# Patient Record
Sex: Male | Born: 1970 | Race: White | Hispanic: No | Marital: Married
Health system: Southern US, Community
[De-identification: ages and names within clinical notes are randomized; demographics above are authoritative.]

## PROBLEM LIST (undated history)

## (undated) DIAGNOSIS — I10 Essential (primary) hypertension: Secondary | ICD-10-CM

---

## 2021-02-03 ENCOUNTER — Emergency Department (HOSPITAL_COMMUNITY)
Admission: EM | Admit: 2021-02-03 | Discharge: 2021-02-03 | Disposition: A | Discharge status: Home / Self Care | Payer: BLUE CROSS/BLUE SHIELD | Attending: Emergency Medicine | Admitting: Emergency Medicine

## 2021-02-03 ENCOUNTER — Emergency Department (HOSPITAL_COMMUNITY): Payer: BLUE CROSS/BLUE SHIELD

## 2021-02-03 ENCOUNTER — Encounter (HOSPITAL_COMMUNITY): Payer: Self-pay | Admitting: *Deleted

## 2021-02-03 DIAGNOSIS — R079 Chest pain, unspecified: Secondary | ICD-10-CM | POA: Diagnosis present

## 2021-02-03 DIAGNOSIS — R42 Dizziness and giddiness: Secondary | ICD-10-CM | POA: Diagnosis not present

## 2021-02-03 DIAGNOSIS — I10 Essential (primary) hypertension: Secondary | ICD-10-CM | POA: Diagnosis not present

## 2021-02-03 DIAGNOSIS — Z79899 Other long term (current) drug therapy: Secondary | ICD-10-CM | POA: Insufficient documentation

## 2021-02-03 HISTORY — DX: Essential (primary) hypertension: I10

## 2021-02-03 LAB — BASIC METABOLIC PANEL
Anion gap: 9 (ref 5–15)
BUN: 13 mg/dL (ref 6–20)
CO2: 23 mmol/L (ref 22–32)
Calcium: 8.9 mg/dL (ref 8.9–10.3)
Chloride: 105 mmol/L (ref 98–111)
Creatinine, Ser: 0.97 mg/dL (ref 0.61–1.24)
GFR, Estimated: >60 mL/min (ref >60)
Glucose, Bld: 96 mg/dL (ref 70–99)
Potassium: 3.7 mmol/L (ref 3.5–5.1)
Sodium: 137 mmol/L (ref 135–145)

## 2021-02-03 LAB — CBC WITH DIFFERENTIAL/PLATELET
Abs Immature Granulocytes: 0.04 10*3/uL (ref 0.00–0.07)
Basophils Absolute: 0.1 10*3/uL (ref 0.0–0.1)
Basophils Relative: 1 %
Eosinophils Absolute: 0.1 10*3/uL (ref 0.0–0.5)
Eosinophils Relative: 1 %
HCT: 43.1 % (ref 39.0–52.0)
Hemoglobin: 15.4 g/dL (ref 13.0–17.0)
Immature Granulocytes: 1 %
Lymphocytes Relative: 36 %
Lymphs Abs: 2.2 10*3/uL (ref 0.7–4.0)
MCH: 32.8 pg (ref 26.0–34.0)
MCHC: 35.7 g/dL (ref 30.0–36.0)
MCV: 91.9 fL (ref 80.0–100.0)
Monocytes Absolute: 0.5 10*3/uL (ref 0.1–1.0)
Monocytes Relative: 8 %
Neutro Abs: 3.1 10*3/uL (ref 1.7–7.7)
Neutrophils Relative %: 53 %
Platelets: 219 10*3/uL (ref 150–400)
RBC: 4.69 MIL/uL (ref 4.22–5.81)
RDW: 13.2 % (ref 11.5–15.5)
WBC: 5.9 10*3/uL (ref 4.0–10.5)
nRBC: 0 % (ref 0.0–0.2)

## 2021-02-03 LAB — TROPONIN I (HIGH SENSITIVITY)
Troponin I (High Sensitivity): 7 ng/L (ref <18)
Troponin I (High Sensitivity): 7 ng/L (ref <18)

## 2021-02-03 MED ORDER — LABETALOL HCL 5 MG/ML IV SOLN
10.0000 mg | Freq: Once | INTRAVENOUS | Status: AC
  Administered 2021-02-03: 10 mg via INTRAVENOUS
  Filled 2021-02-03: qty 4

## 2021-02-03 MED ORDER — LOSARTAN POTASSIUM 25 MG PO TABS
50.0000 mg | ORAL_TABLET | Freq: Once | ORAL | Status: AC
  Administered 2021-02-03: 50 mg via ORAL
  Filled 2021-02-03: qty 2

## 2021-02-03 NOTE — Discharge Instructions (Signed)
Follow-up with your doctor.  Continue take your blood pressure medicine.  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  Your doctor can help follow-up with this a little bit more.  It does not appear to be the cause of today's episode.

## 2021-02-03 NOTE — ED Provider Notes (Signed)
[facility] EMERGENCY DEPARTMENT Provider Note   CSN: [id number] Arrival date & time: 02/03/21  1157     History Chief Complaint  Patient presents with   Chest Pain    Charles Reyes is a 50 y.o. male.  The history is provided by the patient.  Chest Pain Associated symptoms: dizziness and headache   Associated symptoms: no abdominal pain, no back pain and no shortness of breath   Patient presents with chest pressure headache dizziness and hypertension.  Has a history of hypertension and is on 50 mg losartan for it.  States has been out of the medicine for around 4 days because he is traveling.  States he left at home.  Due to go back home on a 730 flight to [place] today.  States he has a dull headache.  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  Slight chest tightness.  Checked a blood pressure and it was elevated.  Has shakiness in his eyes but states this is chronic since he was a kid.    Past Medical History:  Diagnosis Date   Hypertension   Crohns disease  There are no problems to display for this patient.   History reviewed. No pertinent surgical history.     No family history on file.     Home Medications Prior to Admission medications   Medication Sig Start Date End Date Taking? Authorizing Provider  acetaminophen (TYLENOL) 325 MG tablet Take 325 mg by mouth every 4 (four) hours as needed for mild pain, moderate pain, headache or fever. 02/02/21  Yes [provider]  dexlansoprazole (DEXILANT) 60 MG capsule Take 1 capsule by mouth daily. 11/24/20  Yes [provider]  diphenhydrAMINE (BENADRYL) 50 MG/ML injection Inject 50 mg into the muscle once. 02/02/21  Yes [provider]  losartan (COZAAR) 50 MG tablet Take 50 mg by mouth daily. 02/03/21  Yes [provider]  mercaptopurine (PURINETHOL) 50 MG tablet Take 50 mg by mouth daily. 12/20/20  Yes [provider]  REMICADE 100 MG injection Inject into  the vein. 02/02/21  Yes [provider]  XYOSTED 75 MG/0.5ML SOAJ Inject 75 mg into the skin once a week. 01/09/21  Yes [provider]    Allergies    Doxycycline  Review of Systems   Review of Systems  Constitutional:  Negative for appetite change.  HENT:  Negative for congestion.   Respiratory:  Negative for shortness of breath.   Cardiovascular:  Positive for chest pain.  Gastrointestinal:  Negative for abdominal pain.  Genitourinary:  Negative for flank pain.  Musculoskeletal:  Negative for back pain.  Skin:  Negative for rash.  Neurological:  Positive for dizziness and headaches.  Psychiatric/Behavioral:  Negative for confusion.    Physical Exam Updated Vital Signs BP (!) 161/136   Pulse 77   Temp 98.5 F (36.9 C) (Oral)   Resp 17   SpO2 98%   Physical Exam Vitals and nursing note reviewed.  Eyes:     Comments: Patient with baseline lateral nystagmus.  Chronic for patient.  Cardiovascular:     Rate and Rhythm: Regular rhythm.  Pulmonary:     Breath sounds: No wheezing.  Chest:     Chest wall: No tenderness.  Abdominal:     Tenderness: There is no abdominal tenderness.  Musculoskeletal:     Cervical back: Neck supple.     Right lower leg: No edema.     Left lower leg: No edema.  Skin:    General: Skin is warm.     Capillary Refill: Capillary refill takes less than 2 seconds.  Neurological:     Mental Status: He is alert.     Comments: Nystagmus.  Chronic.  Extraocular movements otherwise intact.  Finger-nose intact bilaterally.    ED Results / Procedures / Treatments   Labs (all labs ordered are listed, but only abnormal results are displayed) Labs Reviewed  CBC WITH DIFFERENTIAL/PLATELET  BASIC METABOLIC PANEL  TROPONIN I (HIGH SENSITIVITY)  TROPONIN I (HIGH SENSITIVITY)    EKG EKG Interpretation  Date/Time:  Friday February 03 2021 12:07:29 EST Ventricular Rate:  77 PR Interval:  144 QRS Duration: 87 QT Interval:  356 QTC  Calculation: 403 R Axis:   72 Text Interpretation: Sinus rhythm Borderline repolarization abnormality Confirmed by Benjiman Core [id number]) on 02/03/2021 12:37:53 PM  Radiology CT Head Wo Contrast  Result Date: 02/03/2021 CLINICAL DATA:  Headache with dizziness and nausea. EXAM: CT HEAD WITHOUT CONTRAST TECHNIQUE: Contiguous axial images were obtained from the base of the skull through the vertex without intravenous contrast. COMPARISON:  None. FINDINGS: Brain: There is no evidence for acute hemorrhage, hydrocephalus, mass lesion, or abnormal extra-axial fluid collection. No definite CT evidence for acute infarction. Tiny lacunar infarct noted left cerebellum. Vascular: No hyperdense vessel or unexpected calcification. Skull: No evidence for fracture. No worrisome lytic or sclerotic lesion. Sinuses/Orbits: The visualized paranasal sinuses and mastoid air cells are clear. Visualized portions of the globes and intraorbital fat are unremarkable. Other: None. IMPRESSION: 1. No acute intracranial abnormality. 2. Tiny lacunar infarct in the left cerebellum. Electronically Signed   By: Kennith Center M.D.   On: 02/03/2021 13:23   MR BRAIN WO CONTRAST  Result Date: 02/03/2021 CLINICAL DATA:  Dizziness, nonspecific. Additional history provided: Patient reports running out of blood pressure medication, dizziness, nausea, chest pain. EXAM: MRI HEAD WITHOUT CONTRAST TECHNIQUE: Multiplanar, multiecho pulse sequences of the brain and surrounding structures were obtained without intravenous contrast. COMPARISON:  Head CT 02/03/2021. FINDINGS: Brain: Cerebral volume is within normal limits for age. Small chronic lacunar infarct within the inferior left cerebellar hemisphere. No cortical encephalomalacia is identified. No significant cerebral white matter disease. There is no acute infarct. No evidence of an intracranial mass. No chronic intracranial blood products. No extra-axial fluid collection. No midline shift.  Vascular: Maintained flow voids within the proximal large arterial vessels. 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 Skull and upper cervical spine: No focal suspicious marrow lesion. Sinuses/Orbits: Visualized orbits show no acute finding. No significant paranasal sinus disease. IMPRESSION: No evidence of acute intracranial abnormality. Small chronic lacunar infarct within the inferior left cerebellar hemisphere. Otherwise unremarkable non-contrast MRI appearance of the brain. Electronically Signed   By: Jackey Loge D.O.   On: 02/03/2021 14:49   DG Chest Portable 1 View  Result Date: 02/03/2021 CLINICAL DATA:  Chest pain, dizziness EXAM: PORTABLE CHEST 1 VIEW COMPARISON:  None. FINDINGS: The heart size and mediastinal contours are within normal limits. Both lungs are clear. The visualized skeletal structures are unremarkable. IMPRESSION: No active disease. Electronically Signed   By: Larose Hires D.O.   On: 02/03/2021 12:55    Procedures Procedures   Medications Ordered in ED Medications  losartan (COZAAR) tablet 50 mg (50 mg Oral Given 02/03/21 1249)  labetalol (NORMODYNE) injection 10 mg (10 mg Intravenous Given 02/03/21 1250)    ED Course  I have reviewed the triage vital signs and the nursing notes.  Pertinent labs & imaging results that were available during my care of the patient were reviewed by me and considered in my medical decision making (see chart for details).    MDM Rules/Calculators/A&P                          Patient presents with hypertension and headache.  Has hypertension likely due to noncompliance with medication since he left town.  However developed headache and chest pain.  Feeling dizzy and some nauseousness.  Does have a chronic nystagmus since he had since he was a child. Head CT showed possible old cerebellar stroke.  MRI done due to neurodeficits including dizziness.  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  Patient was unaware of this.  Unsure of cause.  Or even timing of it.  Blood pressure better and symptoms improved.  Will discharge home.  Patient is flying back to [place] today  Final Clinical Impression(s) / ED Diagnoses Final diagnoses:  Hypertension, unspecified type    Rx / DC Orders ED Discharge Orders     None        Benjiman Core, MD 02/03/21 1529

## 2021-02-03 NOTE — ED Notes (Signed)
Pt to MRI

## 2021-02-03 NOTE — ED Triage Notes (Signed)
States he has been out of blood pressure medications for the past 4 days. C/o feeling dizzy and nauseated. Has a headache and also has chest pain. State he travels and left his medication at home.

## 2022-11-07 IMAGING — MR MR HEAD W/O CM
9 of 10 series · 39 of 48 positions shown · non-contrast
Comparison: Head CT 02/03/2021.

CLINICAL DATA: Dizziness, nonspecific. Additional history provided:
Patient reports running out of blood pressure medication, dizziness,
nausea, chest pain.

EXAM:
MRI HEAD WITHOUT CONTRAST
TECHNIQUE: Multiplanar, multiecho pulse sequences of the brain and surrounding
structures were obtained without intravenous contrast.

[Series 5: DWI · axial · 4.0mm · 0.88mm/px · z∈[-72,+68]mm · 5 of 36 slices shown (1 of 4)]
[im 1/36]
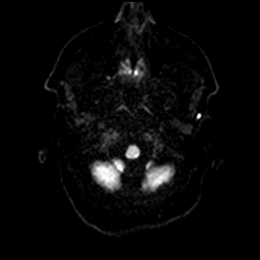
[im 9/36]
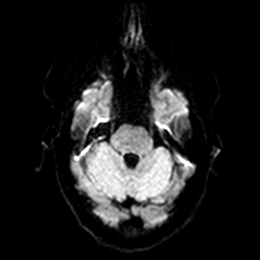
[im 18/36]
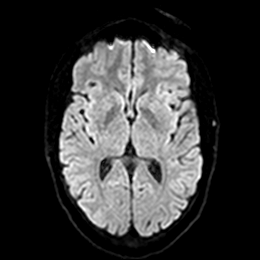
[im 27/36]
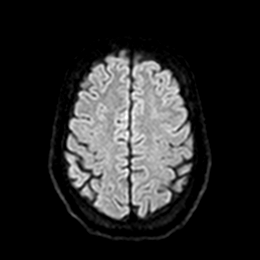
[im 36/36]
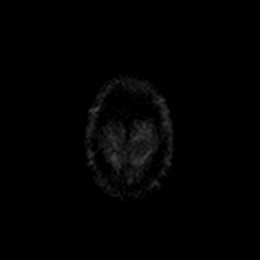

[Series 6: DWI · axial · 4.0mm · 0.88mm/px · z∈[-72,+68]mm · 5 of 36 slices shown (2 of 4)]
[im 1/36]
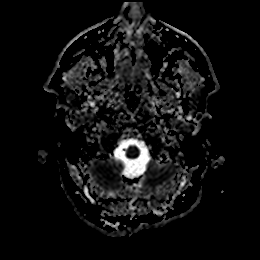
[im 9/36]
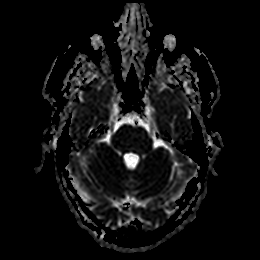
[im 18/36]
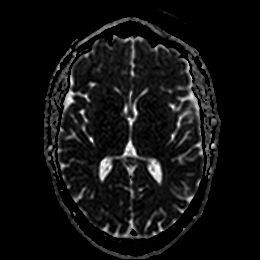
[im 27/36]
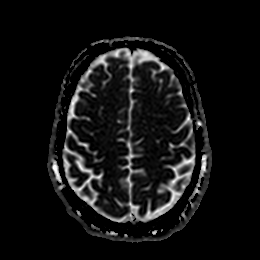
[im 36/36]
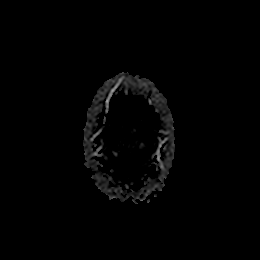

[Series 7: DWI · coronal · 4.0mm · 0.88mm/px · 5 of 32 slices shown (3 of 4)]
[im 1/32]
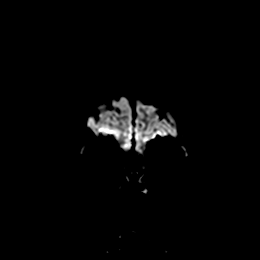
[im 8/32]
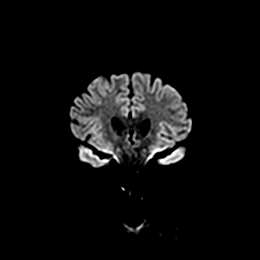
[im 16/32]
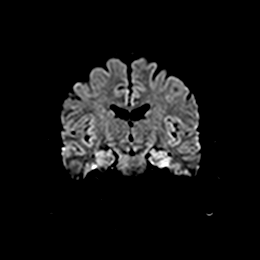
[im 24/32]
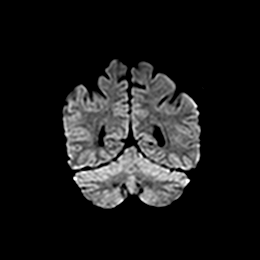
[im 32/32]
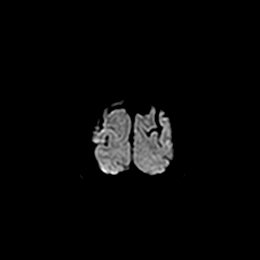

[Series 8: DWI · coronal · 4.0mm · 0.88mm/px · 5 of 32 slices shown (4 of 4)]
[im 1/32]
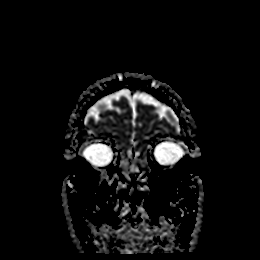
[im 8/32]
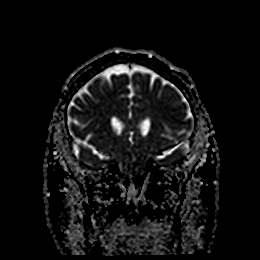
[im 16/32]
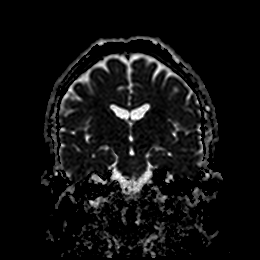
[im 24/32]
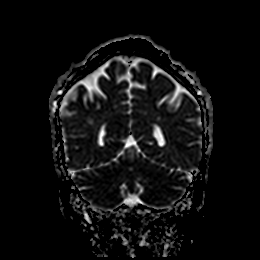
[im 32/32]
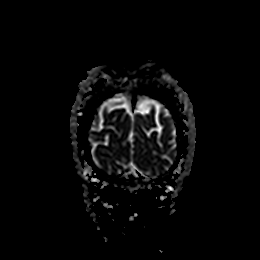

[Series 9: T1 · sagittal · 5.0mm · 0.87mm/px · 3 of 23 slices shown]
[im 1/23]
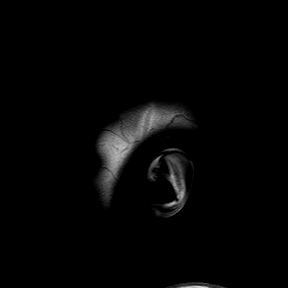
[im 12/23]
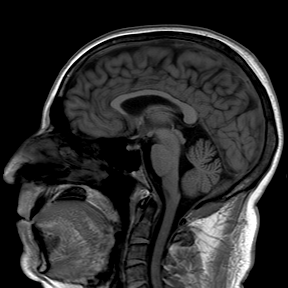
[im 23/23]
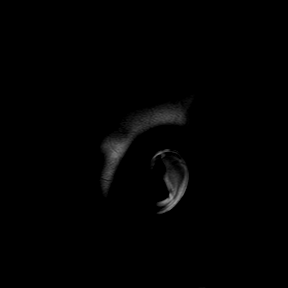

[Series 10: T2 · axial · 5.0mm · 0.72mm/px · z∈[-79,+74]mm · 3 of 23 slices shown (1 of 2)]
[im 1/23]
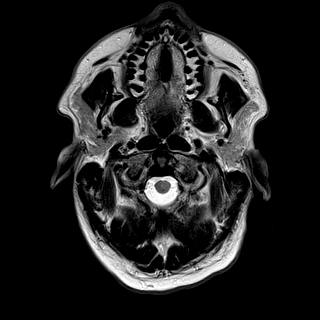
[im 12/23]
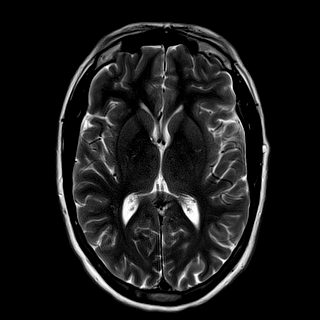
[im 23/23]
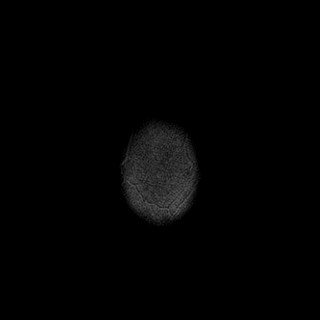

[Series 11: ax hemo · axial · 5.0mm · 0.86mm/px · z∈[-74,+69]mm · 4 of 25 slices shown]
[im 1/25]
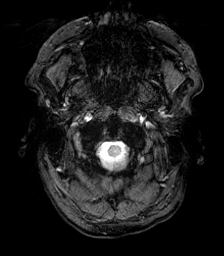
[im 9/25]
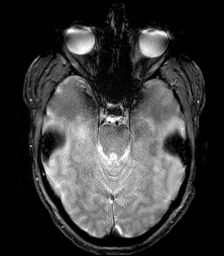
[im 17/25]
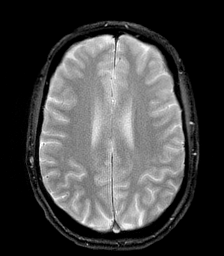
[im 25/25]
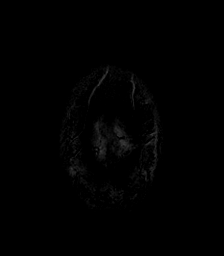

[Series 12: FLAIR · axial · 4.0mm · 0.43mm/px · z∈[-76,+71]mm · 5 of 38 slices shown]
[im 1/38]
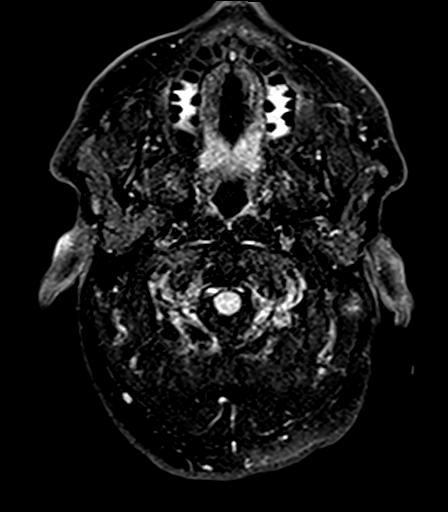
[im 10/38]
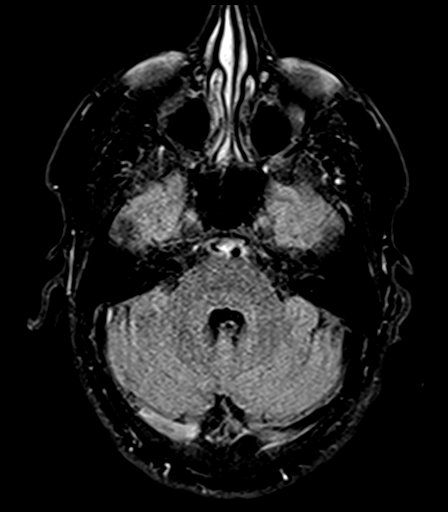
[im 19/38]
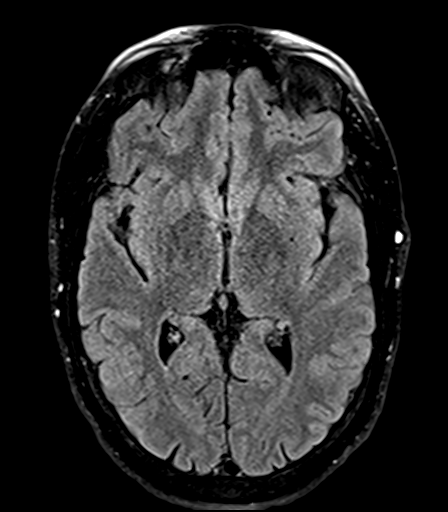
[im 28/38]
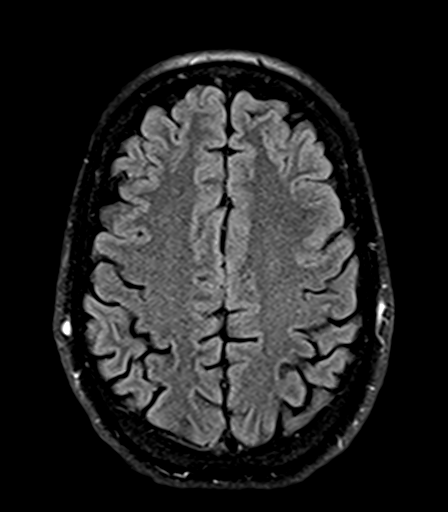
[im 38/38]
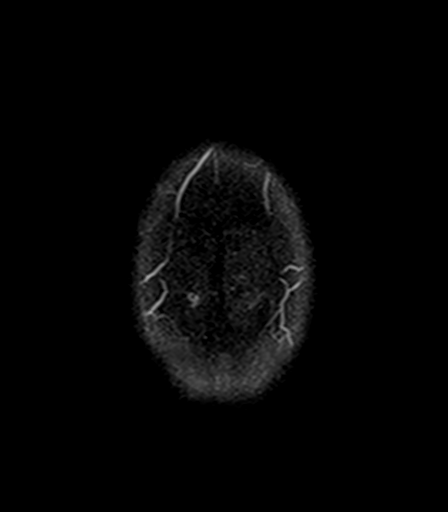

[Series 14: T2 · coronal · 5.0mm · 0.72mm/px · 4 of 28 slices shown (2 of 2)]
[im 1/28]
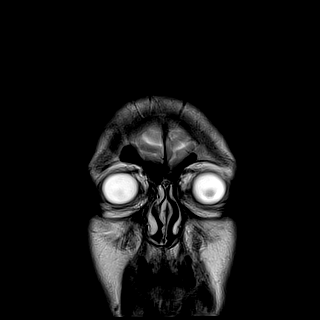
[im 10/28]
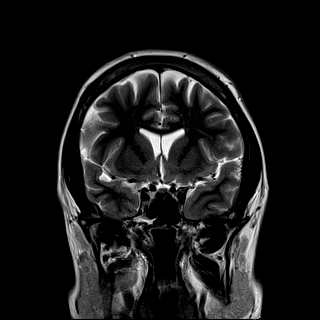
[im 19/28]
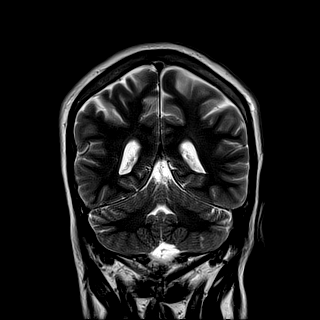
[im 28/28]
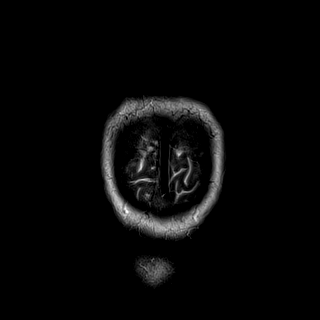

[39 of 48 positions shown; findings below may reference images not displayed]

FINDINGS: Brain:

Cerebral volume is within normal limits for age.

Small chronic lacunar infarct within the inferior left cerebellar
hemisphere.

No cortical encephalomalacia is identified. No significant cerebral
white matter disease.

There is no acute infarct.

No evidence of an intracranial mass.

No chronic intracranial blood products.

No extra-axial fluid collection.

No midline shift.

Vascular: Maintained flow voids within the proximal large arterial
vessels. Developmental venous anomaly within the right cerebellar
hemisphere (anatomic variant).

Skull and upper cervical spine: No focal suspicious marrow lesion.

Sinuses/Orbits: Visualized orbits show no acute finding. No
significant paranasal sinus disease.
IMPRESSION: No evidence of acute intracranial abnormality.

Small chronic lacunar infarct within the inferior left cerebellar
hemisphere.

Otherwise unremarkable non-contrast MRI appearance of the brain.

## 2022-11-07 IMAGING — DX DG CHEST 1V PORT
1 series · 1 of 1 positions shown · non-contrast
Comparison: None.

CLINICAL DATA: Chest pain, dizziness

EXAM:
PORTABLE CHEST 1 VIEW

[chest ap]
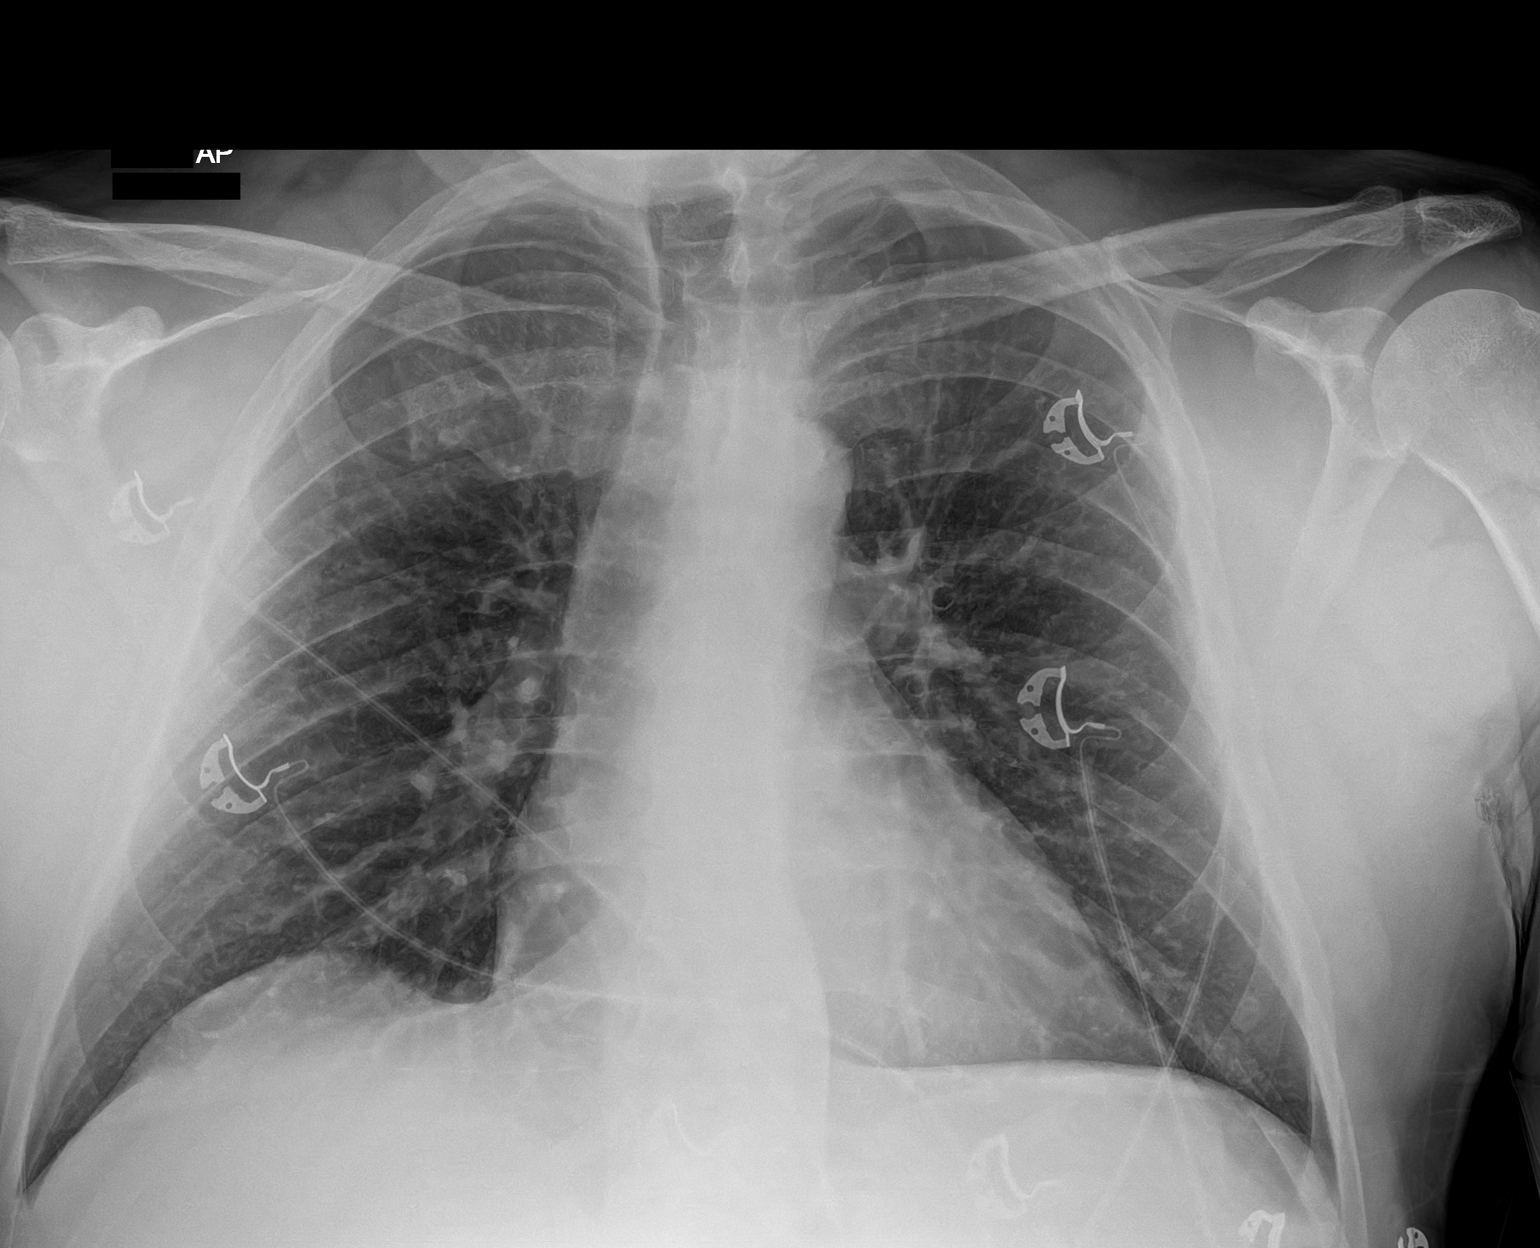

[1 of 1 positions shown; findings below may reference images not displayed]

FINDINGS: The heart size and mediastinal contours are within normal limits.
Both lungs are clear. The visualized skeletal structures are
unremarkable.
IMPRESSION: No active disease.
